# Patient Record
Sex: Male | Born: 1960 | Race: White | Hispanic: No | Marital: Married | Smoking: Current some day smoker
Health system: Southern US, Community
[De-identification: ages and names within clinical notes are randomized; demographics above are authoritative.]

## PROBLEM LIST (undated history)

## (undated) DIAGNOSIS — I10 Essential (primary) hypertension: Secondary | ICD-10-CM

---

## 2013-10-12 ENCOUNTER — Ambulatory Visit (INDEPENDENT_AMBULATORY_CARE_PROVIDER_SITE_OTHER): Payer: Self-pay | Admitting: Emergency Medicine

## 2013-10-12 VITALS — BP 180/112 | HR 95 | Temp 97.8°F | Resp 18 | Ht 68.0 in | Wt 265.6 lb

## 2013-10-12 DIAGNOSIS — I10 Essential (primary) hypertension: Secondary | ICD-10-CM | POA: Insufficient documentation

## 2013-10-12 DIAGNOSIS — F319 Bipolar disorder, unspecified: Secondary | ICD-10-CM

## 2013-10-12 DIAGNOSIS — Z0000 Encounter for general adult medical examination without abnormal findings: Secondary | ICD-10-CM

## 2013-10-12 NOTE — Progress Notes (Signed)
   Subjective:  This chart was scribed for Jonathan ChrisSteven Daub, MD by Elveria Risingimelie Horne, Medial Scribe. This patient was seen in room 13 and the patient's care was started at 6:05 PM.    Patient ID: Jonathan Hays, male    DOB: 07-04-1960, 53 y.o.   MRN: [id number]  HPI HPI Comments: Jonathan NyChristopher Loch is a 53 y.o. male who presents to the [facility] for complete DOT physical. Patient admits that he has not been taking his HTN medication for a few months; state that he just got lazy. Patient's current blood pressure is too high for him to qualify for a card today. Patient's blood pressure today is 164/116.   There are no active problems to display for this patient.  No past medical history on file. No past surgical history on file. No Known Allergies Prior to Admission medications   Medication Sig Start Date End Date Taking? Authorizing Duquan Gillooly  divalproex (DEPAKOTE ER) 500 MG 24 hr tablet Take 1,500 mg by mouth daily.   Yes Historical Violett Hobbs, MD  hydrochlorothiazide (HYDRODIURIL) 25 MG tablet Take 25 mg by mouth daily.   Yes Historical Anjalina Bergevin, MD  lisinopril (PRINIVIL,ZESTRIL) 20 MG tablet Take 20 mg by mouth daily.   Yes Historical Caston Coopersmith, MD  Omega-3 Fatty Acids (FISH OIL) 1000 MG CAPS Take 1,000 mg by mouth daily.   Yes Historical Tammala Weider, MD  QUEtiapine (SEROQUEL) 400 MG tablet Take 400 mg by mouth at bedtime.   Yes Historical Zaida Reiland, MD  simvastatin (ZOCOR) 40 MG tablet Take 40 mg by mouth daily.   Yes Historical Aerion Bagdasarian, MD   History   Social History  . Marital Status: Married    Spouse Name: N/A    Number of Children: N/A  . Years of Education: N/A   Occupational History  . Not on file.   Social History Main Topics  . Smoking status: Not on file  . Smokeless tobacco: Not on file  . Alcohol Use: No  . Drug Use: No  . Sexual Activity: Not on file   Other Topics Concern  . Not on file   Social History Narrative  . No narrative on file        Review of Systems     Objective:   Physical Exam  Nursing note and vitals reviewed.  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 Has been off blood pressure medications for 2 months. He was advised to get three blood pressure readings under 140/90. He was not issued a DOT card today. Patient disqualified until blood pressure is normalizes.

## 2013-10-16 ENCOUNTER — Ambulatory Visit (INDEPENDENT_AMBULATORY_CARE_PROVIDER_SITE_OTHER): Payer: Self-pay | Admitting: Emergency Medicine

## 2013-10-16 VITALS — BP 138/80 | HR 99 | Temp 97.5°F | Resp 18

## 2013-10-16 DIAGNOSIS — Z0289 Encounter for other administrative examinations: Secondary | ICD-10-CM

## 2013-10-16 NOTE — Progress Notes (Signed)
[facility] [address], [place] [id number] [phone]  Date:  10/16/2013   Name:  Jonathan Hays Whipple   DOB:  Nov 18, 1960   MRN:  [id number]  PCP:  No PCP Per Patient    Chief Complaint: No chief complaint on file.   History of Present Illness:  Jonathan Hays Feely is a 53 y.o. very pleasant male patient who presents with the following:  For DOT certification.  HBP and was irregularly taking medications.  Denies other complaint or health concern today. .    Patient Active Problem List   Diagnosis Date Noted  . Unspecified essential hypertension 10/12/2013  . Bipolar disorder, unspecified 10/12/2013    History reviewed. No pertinent past medical history.  History reviewed. No pertinent past surgical history.  History  Substance Use Topics  . Smoking status: Not on file  . Smokeless tobacco: Not on file  . Alcohol Use: No    Family History  Problem Relation Age of Onset  . COPD Father     No Known Allergies  Medication list has been reviewed and updated.  Current Outpatient Prescriptions on File Prior to Visit  Medication Sig Dispense Refill  . divalproex (DEPAKOTE ER) 500 MG 24 hr tablet Take 1,500 mg by mouth daily.      . hydrochlorothiazide (HYDRODIURIL) 25 MG tablet Take 25 mg by mouth daily.      Marland Kitchen. lisinopril (PRINIVIL,ZESTRIL) 20 MG tablet Take 20 mg by mouth daily.      . Omega-3 Fatty Acids (FISH OIL) 1000 MG CAPS Take 1,000 mg by mouth daily.      . QUEtiapine (SEROQUEL) 400 MG tablet Take 400 mg by mouth at bedtime.      . simvastatin (ZOCOR) 40 MG tablet Take 40 mg by mouth daily.       No current facility-administered medications on file prior to visit.    Review of Systems:  As per HPI, otherwise negative.    Physical Examination: Filed Vitals:   10/16/13 1307  BP: 138/80  Pulse: 99  Temp: 97.5 F (36.4 C)  Resp: 18   There were no vitals filed for this visit. There is no weight on file to calculate  BMI. Ideal Body Weight:    GEN: WDWN, NAD, Non-toxic, A & O x 3 HEENT: Atraumatic, Normocephalic. Neck supple. No masses, No LAD. Ears and Nose: No external deformity. CV: RRR, No M/G/R. No JVD. No thrill. No extra heart sounds. PULM: CTA B, no wheezes, crackles, rhonchi. No retractions. No resp. distress. No accessory muscle use. ABD: S, NT, ND, +BS. No rebound. No HSM. EXTR: No c/c/e NEURO Normal gait.  PSYCH: Normally interactive. Conversant. Not depressed or anxious appearing.  Calm demeanor.    Assessment and Plan: DOT certification.   HBP Bipolar disorder   Signed,  Phillips OdorJeffery Anderson, MD

## 2013-10-16 NOTE — Patient Instructions (Signed)
Hypertension Hypertension, commonly called high blood pressure, is when the force of blood pumping through your arteries is too strong. Your arteries are the blood vessels that carry blood from your heart throughout your body. A blood pressure reading consists of a higher number over a lower number, such as 110/72. The higher number (systolic) is the pressure inside your arteries when your heart pumps. The lower number (diastolic) is the pressure inside your arteries when your heart relaxes. Ideally you want your blood pressure below 120/80. Hypertension forces your heart to work harder to pump blood. Your arteries may become narrow or stiff. Having hypertension puts you at risk for heart disease, stroke, and other problems.  RISK FACTORS Some risk factors for high blood pressure are controllable. Others are not.  Risk factors you cannot control include:   Race. You may be at higher risk if you are African American.  Age. Risk increases with age.  Gender. Men are at higher risk than women before age 45 years. After age 65, women are at higher risk than men. Risk factors you can control include:  Not getting enough exercise or physical activity.  Being overweight.  Getting too much fat, sugar, calories, or salt in your diet.  Drinking too much alcohol. SIGNS AND SYMPTOMS Hypertension does not usually cause signs or symptoms. Extremely high blood pressure (hypertensive crisis) may cause headache, anxiety, shortness of breath, and nosebleed. DIAGNOSIS  To check if you have hypertension, your health care provider will measure your blood pressure while you are seated, with your arm held at the level of your heart. It should be measured at least twice using the same arm. Certain conditions can cause a difference in blood pressure between your right and left arms. A blood pressure reading that is higher than normal on one occasion does not mean that you need treatment. If one blood pressure reading  is high, ask your health care provider about having it checked again. TREATMENT  Treating high blood pressure includes making lifestyle changes and possibly taking medication. Living a healthy lifestyle can help lower high blood pressure. You may need to change some of your habits. Lifestyle changes may include:  Following the DASH diet. This diet is high in fruits, vegetables, and whole grains. It is low in salt, red meat, and added sugars.  Getting at least 2 1/2 hours of brisk physical activity every week.  Losing weight if necessary.  Not smoking.  Limiting alcoholic beverages.  Learning ways to reduce stress. If lifestyle changes are not enough to get your blood pressure under control, your health care provider may prescribe medicine. You may need to take more than one. Work closely with your health care provider to understand the risks and benefits. HOME CARE INSTRUCTIONS  Have your blood pressure rechecked as directed by your health care provider.   Only take medicine as directed by your health care provider. Follow the directions carefully. Blood pressure medicines must be taken as prescribed. The medicine does not work as well when you skip doses. Skipping doses also puts you at risk for problems.   Do not smoke.   Monitor your blood pressure at home as directed by your health care provider. SEEK MEDICAL CARE IF:   You think you are having a reaction to medicines taken.  You have recurrent headaches or feel dizzy.  You have swelling in your ankles.  You have trouble with your vision. SEEK IMMEDIATE MEDICAL CARE IF:  You develop a severe headache or   confusion.  You have unusual weakness, numbness, or feel faint.  You have severe chest or abdominal pain.  You vomit repeatedly.  You have trouble breathing. MAKE SURE YOU:   Understand these instructions.  Will watch your condition.  Will get help right away if you are not doing well or get  worse. Document Released: 04/05/2005 Document Revised: 04/10/2013 Document Reviewed: 01/26/2013 Exit[employer] Patient Information 2015 [employer]. This information is not intended to replace advice given to you by your health care provider. Make sure you discuss any questions you have with your health care provider.  

## 2015-08-19 ENCOUNTER — Ambulatory Visit
Admission: EM | Admit: 2015-08-19 | Discharge: 2015-08-19 | Disposition: A | Discharge status: Home / Self Care | Payer: Worker's Compensation | Attending: Family Medicine | Admitting: Family Medicine

## 2015-08-19 ENCOUNTER — Encounter: Payer: Self-pay | Admitting: Emergency Medicine

## 2015-08-19 DIAGNOSIS — S61210A Laceration without foreign body of right index finger without damage to nail, initial encounter: Secondary | ICD-10-CM | POA: Diagnosis not present

## 2015-08-19 HISTORY — DX: Essential (primary) hypertension: I10

## 2015-08-19 MED ORDER — TETANUS-DIPHTH-ACELL PERTUSSIS 5-2.5-18.5 LF-MCG/0.5 IM SUSP
0.5000 mL | Freq: Once | INTRAMUSCULAR | Status: AC
  Administered 2015-08-19: 0.5 mL via INTRAMUSCULAR

## 2015-08-19 NOTE — ED Notes (Signed)
Patient states he cut his right index finger on a piece of metal. Is not sure when last t-dap was.

## 2015-08-19 NOTE — Discharge Instructions (Signed)
Keep clean and dry. Keep covered at work.  Follow up with your primary care physician this week as needed. Return to Urgent care for new or worsening concerns.    Laceration Care, Adult A laceration is a cut that goes through all layers of the skin. The cut also goes into the tissue that is right under the skin. Some cuts heal on their own. Others need to be closed with stitches (sutures), staples, skin adhesive strips, or wound glue. Taking care of your cut lowers your risk of infection and helps your cut to heal better. HOW TO TAKE CARE OF YOUR CUT For stitches or staples:  Keep the wound clean and dry.  If you were given a bandage (dressing), you should change it at least one time per day or as told by your doctor. You should also change it if it gets wet or dirty.  Keep the wound completely dry for the first 24 hours or as told by your doctor. After that time, you may take a shower or a bath. However, make sure that the wound is not soaked in water until after the stitches or staples have been removed.  Clean the wound one time each day or as told by your doctor:  Wash the wound with soap and water.  Rinse the wound with water until all of the soap comes off.  Pat the wound dry with a clean towel. Do not rub the wound.  After you clean the wound, put a thin layer of antibiotic ointment on it as told by your doctor. This ointment:  Helps to prevent infection.  Keeps the bandage from sticking to the wound.  Have your stitches or staples removed as told by your doctor. If your doctor used skin adhesive strips:   Keep the wound clean and dry.  If you were given a bandage, you should change it at least one time per day or as told by your doctor. You should also change it if it gets dirty or wet.  Do not get the skin adhesive strips wet. You can take a shower or a bath, but be careful to keep the wound dry.  If the wound gets wet, pat it dry with a clean towel. Do not rub the  wound.  Skin adhesive strips fall off on their own. You can trim the strips as the wound heals. Do not remove any strips that are still stuck to the wound. They will fall off after a while. If your doctor used wound glue:  Try to keep your wound dry, but you may briefly wet it in the shower or bath. Do not soak the wound in water, such as by swimming.  After you take a shower or a bath, gently pat the wound dry with a clean towel. Do not rub the wound.  Do not do any activities that will make you really sweaty until the skin glue has fallen off on its own.  Do not apply liquid, cream, or ointment medicine to your wound while the skin glue is still on.  If you were given a bandage, you should change it at least one time per day or as told by your doctor. You should also change it if it gets dirty or wet.  If a bandage is placed over the wound, do not let the tape for the bandage touch the skin glue.  Do not pick at the glue. The skin glue usually stays on for 5-10 days. Then, it falls  off of the skin. General Instructions  To help prevent scarring, make sure to cover your wound with sunscreen whenever you are outside after stitches are removed, after adhesive strips are removed, or when wound glue stays in place and the wound is healed. Make sure to wear a sunscreen of at least 30 SPF.  Take over-the-counter and prescription medicines only as told by your doctor.  If you were given antibiotic medicine or ointment, take or apply it as told by your doctor. Do not stop using the antibiotic even if your wound is getting better.  Do not scratch or pick at the wound.  Keep all follow-up visits as told by your doctor. This is important.  Check your wound every day for signs of infection. Watch for:  Redness, swelling, or pain.  Fluid, blood, or pus.  Raise (elevate) the injured area above the level of your heart while you are sitting or lying down, if possible. GET HELP IF:  You got a  tetanus shot and you have any of these problems at the injection site:  Swelling.  Very bad pain.  Redness.  Bleeding.  You have a fever.  A wound that was closed breaks open.  You notice a bad smell coming from your wound or your bandage.  You notice something coming out of the wound, such as wood or glass.  Medicine does not help your pain.  You have more redness, swelling, or pain at the site of your wound.  You have fluid, blood, or pus coming from your wound.  You notice a change in the color of your skin near your wound.  You need to change the bandage often because fluid, blood, or pus is coming from the wound.  You start to have a new rash.  You start to have numbness around the wound. GET HELP RIGHT AWAY IF:  You have very bad swelling around the wound.  Your pain suddenly gets worse and is very bad.  You notice painful lumps near the wound or on skin that is anywhere on your body.  You have a red streak going away from your wound.  The wound is on your hand or foot and you cannot move a finger or toe like you usually can.  The wound is on your hand or foot and you notice that your fingers or toes look pale or bluish.   This information is not intended to replace advice given to you by your health care provider. Make sure you discuss any questions you have with your health care provider.   Document Released: 09/22/2007 Document Revised: 08/20/2014 Document Reviewed: 04/01/2014 Elsevier Interactive Patient Education 2016 [employer]  Nonsutured Laceration Care A laceration is a cut that goes through all layers of the skin and extends into the tissue that is right under the skin. This type of cut is usually stitched up (sutured) or closed with tape (adhesive strips) or skin glue shortly after the injury happens. However, if the wound is dirty or if several hours pass before medical treatment is provided, it is likely that germs (bacteria) will enter the  wound. Closing a laceration after bacteria have entered it increases the risk of infection. In these cases, your health care provider may leave the laceration open (nonsutured) and cover it with a bandage. This type of treatment helps prevent infection and allows the wound to heal from the deepest layer of tissue damage up to the surface. An open fracture is a type of injury  that may involve nonsutured lacerations. An open fracture is a break in a bone that happens along with one or more lacerations through the skin that is near the fracture site. HOW TO CARE FOR YOUR NONSUTURED LACERATION  Take or apply over-the-counter and prescription medicines only as told by your health care provider.  If you were prescribed an antibiotic medicine, take or apply it as told by your health care provider. Do not stop using the antibiotic even if your condition improves.  Clean the wound one time each day or as told by your health care provider.  Wash the wound with mild soap and water.  Rinse the wound with water to remove all soap.  Pat your wound dry with a clean towel. Do not rub the wound.  Do not inject anything into the wound unless your health care provider told you to.  Change any bandages (dressings) as told by your health care provider. This includes changing the dressing if it gets wet, dirty, or starts to smell bad.  Keep the dressing dry until your health care provider says it can be removed. Do not take baths, swim, or do anything that puts your wound underwater until your health care provider approves.  Raise (elevate) the injured area above the level of your heart while you are sitting or lying down, if possible.  Do not scratch or pick at the wound.  Check your wound every day for signs of infection. Watch for:  Redness, swelling, or pain.  Fluid, blood, or pus.  Keep all follow-up visits as told by your health care provider. This is important. SEEK MEDICAL CARE IF:  You received  a tetanus and shot and you have swelling, severe pain, redness, or bleeding at the injection site.   You have a fever.  Your pain is not controlled with medicine.  You have increased redness, swelling, or pain at the site of your wound.  You have fluid, blood, or pus coming from your wound.  You notice a bad smell coming from your wound or your dressing.  You notice something coming out of the wound, such as wood or glass.  You notice a change in the color of your skin near your wound.  You develop a new rash.  You need to change the dressing frequently due to fluid, blood, or pus draining from the wound.  You develop numbness around your wound. SEEK IMMEDIATE MEDICAL CARE IF:  Your pain suddenly increases and is severe.  You develop severe swelling around the wound.  The wound is on your hand or foot and you cannot properly move a finger or toe.  The wound is on your hand or foot and you notice that your fingers or toes look pale or bluish.  You have a red streak going away from your wound.   This information is not intended to replace advice given to you by your health care provider. Make sure you discuss any questions you have with your health care provider.   Document Released: 03/03/2006 Document Revised: 08/20/2014 Document Reviewed: 04/01/2014 Elsevier Interactive Patient Education [employer]

## 2015-08-19 NOTE — ED Provider Notes (Signed)
[facility]  ____________________________________________  Time seen: Approximately 10:09 PM  I have reviewed the triage vital signs and the nursing notes.   HISTORY  Chief Complaint Laceration    HPI Jonathan Hays is a 55 y.o. male presents with complaints of right second digit finger laceration. Patient reports that just prior to arrival he was at work. 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 Patient states that as he was sliding a piece of metal downwards he slid his finger along the outer edge of piece of fresh the cut metal which cut his right second digit. Denies any other trauma or crush injury. Denies any pain this time. 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 Reports he is right-hand dominant. Unsure of last tetanus immunization.  Denies any numbness or tingling sensation. Denies decreased range of motion, other injury or other laceration. Denies fall, head injury or loss consciousness.      Past Medical History  Diagnosis Date  . Hypertension     Patient Active Problem List   Diagnosis Date Noted  . Unspecified essential hypertension 10/12/2013  . Bipolar disorder, unspecified (HCC) 10/12/2013    History reviewed. No pertinent past surgical history.  Current Outpatient Rx  Name  Route  Sig  Dispense  Refill  . divalproex (DEPAKOTE ER) 500 MG 24 hr tablet   Oral   Take 1,500 mg by mouth daily.         . hydrochlorothiazide (HYDRODIURIL) 25 MG tablet   Oral   Take 25 mg by mouth daily.         Marland Kitchen. lisinopril (PRINIVIL,ZESTRIL) 20 MG tablet   Oral   Take 20 mg by mouth daily.         . Omega-3 Fatty Acids (FISH OIL) 1000 MG CAPS   Oral   Take 1,000 mg by mouth daily.         . QUEtiapine (SEROQUEL) 400 MG tablet   Oral   Take 400 mg by mouth at bedtime.         . simvastatin (ZOCOR) 40 MG tablet   Oral   Take 40 mg by mouth daily.           Allergies Review of patient's  allergies indicates no known allergies.  Family History  Problem Relation Age of Onset  . COPD Father     Social History Social History  Substance Use Topics  . Smoking status: Current Some Day Smoker  . Smokeless tobacco: None  . Alcohol Use: No    Review of Systems Constitutional: No fever/chills Eyes: No visual changes. ENT: No sore throat. Cardiovascular: Denies chest pain. Respiratory: Denies shortness of breath. Gastrointestinal: No abdominal pain.  No nausea, no vomiting.  No diarrhea.  No constipation. Genitourinary: Negative for dysuria. Musculoskeletal: Negative for back pain. Skin: Negative for rash.As above.  Neurological: Negative for headaches, focal weakness or numbness.  10-point ROS otherwise negative.  ____________________________________________   PHYSICAL EXAM:  VITAL SIGNS: ED Triage Vitals  Enc Vitals Group     BP 08/19/15 2035 151/83 mmHg     Pulse Rate 08/19/15 2035 80     Resp 08/19/15 2035 20     Temp 08/19/15 2035 97.9 F (36.6 C)     Temp Source 08/19/15 2035 Tympanic     SpO2 08/19/15 2035 96 %     Weight 08/19/15 2035 250 lb (113.399 kg)     Height 08/19/15 2035 5\' 9"  (1.753 m)  Head Cir --      Peak Flow --      Pain Score 08/19/15 2034 0     Pain Loc --      Pain Edu? --      Excl. in GC? --     Constitutional: Alert and oriented. Well appearing and in no acute distress. Eyes: Conjunctivae are normal. PERRL. EOMI. Head: Atraumatic.  Nose: No congestion/rhinnorhea.  Mouth/Throat: Mucous membranes are moist.   Neck: No stridor.  No cervical spine tenderness to palpation.  Cardiovascular: Normal rate, regular rhythm. Grossly normal heart sounds.  Good peripheral circulation. Respiratory: Normal respiratory effort.  No retractions. Lungs CTAB. Gastrointestinal: Soft and nontender.  Musculoskeletal: No lower or upper extremity tenderness nor edema.  Bilateral hand grips strong and equal. Bilateral distal radial pulses equal  and easily palpable. See skin below. Right hand nontender, right hand no motor or tendon deficit, neurovascular intact. Neurologic:  Normal speech and language. No gross focal neurologic deficits are appreciated. No gait instability. Skin:  Skin is warm, dry and intact. No rash noted. Except: Right second digit middle lateral volar phalanx superficial 2.5 cm laceration present with minimal active bleeding, nontender, no swelling, no ecchymosis, nontender, full range of motion, capillary refill less than 2 seconds to distal second right finger, no foreign bodies visualized, no tendon visualized, no motor or tendon deficit. Psychiatric: Mood and affect are normal. Speech and behavior are normal.  ____________________________________________   LABS (all labs ordered are listed, but only abnormal results are displayed)  Labs Reviewed - No data to display ____________________________________________  PROCEDURES  Procedure(s) performed:   Procedure(s) performed:  Procedure explained and verbal consent obtained. Consent: Verbal consent obtained. Written consent not obtained. Risks and benefits: risks, benefits and alternatives were discussed Patient identity confirmed: verbally with patient and hospital-assigned identification number  Consent given by: patient   Laceration Repair Location: Right second finger Length: 2.5 cm Foreign bodies: no foreign bodies Tendon involvement: none Nerve involvement: none Preparation: Patient was prepped and draped in the usual sterile fashion. Anesthesia none Irrigation solution: saline and Betadine  Irrigation method: jet lavage Amount of cleaning: copious Suturing not indicated. Repaired with sterile adhesive  Patient tolerate well. Wound well approximated post repair.   dressing applied.  Wound care instructions provided.  Observe for any signs of infection or other problems.     ____________________________________________   INITIAL  IMPRESSION / ASSESSMENT AND PLAN / ED COURSE  Pertinent labs & imaging results that were available during my care of the patient were reviewed by me and considered in my medical decision making (see chart for details).  Very well-appearing patient. No acute distress. Presents for complaint of laceration to right second middle phalanx. 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 injury. Superficial 2.5 cm laceration present, nontender, no bony tenderness, neurovascular intact. Wound copiously irrigated and cleaned. Wound repaired with sterile adhesive. Patient tolerated well. Directed to keep clean and dry. Work note given that patient may return to work without any restrictions except for wound area to stay clean and dry for 7 days. Tetanus immunization updated.  Discussed follow up with Primary care physician this week. Discussed follow up and return parameters including no resolution or any worsening concerns. Patient verbalized understanding and agreed to plan.   ____________________________________________   FINAL CLINICAL IMPRESSION(S) / ED DIAGNOSES  Final diagnoses:  Laceration of right index finger w/o foreign body w/o damage to nail, initial encounter      Note: This dictation was prepared  with Dragon dictation along with smaller phrase technology. Any transcriptional errors that result from this process are unintentional.    Renford Dills, NP 08/19/15 2304

## 2018-10-18 ENCOUNTER — Encounter: Payer: Self-pay | Admitting: *Deleted

## 2018-10-18 ENCOUNTER — Emergency Department: Payer: No Typology Code available for payment source

## 2018-10-18 ENCOUNTER — Other Ambulatory Visit: Payer: Self-pay

## 2018-10-18 DIAGNOSIS — Y990 Civilian activity done for income or pay: Secondary | ICD-10-CM | POA: Insufficient documentation

## 2018-10-18 DIAGNOSIS — Y929 Unspecified place or not applicable: Secondary | ICD-10-CM | POA: Insufficient documentation

## 2018-10-18 DIAGNOSIS — W010XXA Fall on same level from slipping, tripping and stumbling without subsequent striking against object, initial encounter: Secondary | ICD-10-CM | POA: Diagnosis not present

## 2018-10-18 DIAGNOSIS — I10 Essential (primary) hypertension: Secondary | ICD-10-CM | POA: Insufficient documentation

## 2018-10-18 DIAGNOSIS — F17210 Nicotine dependence, cigarettes, uncomplicated: Secondary | ICD-10-CM | POA: Diagnosis not present

## 2018-10-18 DIAGNOSIS — Z79899 Other long term (current) drug therapy: Secondary | ICD-10-CM | POA: Insufficient documentation

## 2018-10-18 DIAGNOSIS — Y939 Activity, unspecified: Secondary | ICD-10-CM | POA: Insufficient documentation

## 2018-10-18 DIAGNOSIS — S0990XA Unspecified injury of head, initial encounter: Secondary | ICD-10-CM | POA: Diagnosis not present

## 2018-10-18 NOTE — ED Notes (Signed)
Worker's Comp:  Pt employer requests a UDS.  Consents obtained from pt.  Specimen collected and released to lab via chain of custody protocol.  Specimen ID no [id number]

## 2018-10-18 NOTE — ED Triage Notes (Signed)
Pt fell at work tonight.  Pt stepped onto cardboard that covered a hydraulic spill and pt fell.  Pt had loc and was confused.  Pt brought in via ems.  On arrival to triage, pt alert.  Pt has hematoma to right side of head.  Pt has a headache.  No vomiting.  Pt alert.   Speech clear.  Ambulates without diff.  Pt states WC.

## 2018-10-19 ENCOUNTER — Emergency Department
Admission: EM | Admit: 2018-10-19 | Discharge: 2018-10-19 | Disposition: A | Discharge status: Home / Self Care | Payer: No Typology Code available for payment source | Attending: Emergency Medicine | Admitting: Emergency Medicine

## 2018-10-19 DIAGNOSIS — W19XXXA Unspecified fall, initial encounter: Secondary | ICD-10-CM

## 2018-10-19 DIAGNOSIS — S0990XA Unspecified injury of head, initial encounter: Secondary | ICD-10-CM

## 2018-10-19 NOTE — ED Provider Notes (Signed)
[facility] Emergency Department Provider Note  ____________________________________________   First MD Initiated Contact with Patient 10/19/18 0309     (approximate)  I have reviewed the triage vital signs and the nursing notes.   HISTORY  Chief Complaint Fall    HPI Jonathan Hays is a 58 y.o. male with medical history as listed below who presents for evaluation after a fall at work.  He reports that he slipped in some hydraulic fluid and apparently fell and struck his head.  He does not really remember what happened.  He felt stunned for little while and wonders if he lost consciousness.  He currently has a very mild headache and has a couple of knots on his head.  He has no neck pain.  He denies any recent infection, fever/chills, sore throat, chest pain, shortness of breath, nausea, vomiting, abdominal pain.  He has no pain in his arms or his legs.  He had a history of a prior concussion but he does not participate in any athletic or potentially dangerous activities.  He is ambulatory without difficulty.         Past Medical History:  Diagnosis Date  . Hypertension     Patient Active Problem List   Diagnosis Date Noted  . Unspecified essential hypertension 10/12/2013  . Bipolar disorder, unspecified ([place]) 10/12/2013    No past surgical history on file.  Prior to Admission medications   Medication Sig Start Date End Date Taking? Authorizing Provider  divalproex (DEPAKOTE ER) 500 MG 24 hr tablet Take 1,500 mg by mouth daily.    [provider]  hydrochlorothiazide (HYDRODIURIL) 25 MG tablet Take 25 mg by mouth daily.    [provider]  lisinopril (PRINIVIL,ZESTRIL) 20 MG tablet Take 20 mg by mouth daily.    [provider]  Omega-3 Fatty Acids (FISH OIL) 1000 MG CAPS Take 1,000 mg by mouth daily.    [provider]  QUEtiapine (SEROQUEL) 400 MG tablet Take 400 mg by mouth at bedtime.    [provider]  simvastatin (ZOCOR) 40 MG tablet Take 40 mg by mouth daily.    [provider]    Allergies Patient has no known allergies.  Family History  Problem Relation Age of Onset  . COPD Father     Social History Social History   Tobacco Use  . Smoking status: Current Some Day Smoker  . Smokeless tobacco: Never Used  Substance Use Topics  . Alcohol use: No  . Drug use: No    Review of Systems Constitutional: No fever/chills Eyes: No visual changes. ENT: No sore throat. Cardiovascular: Denies chest pain. Respiratory: Denies shortness of breath. Gastrointestinal: No abdominal pain.  No nausea, no vomiting.  No diarrhea.  No constipation. Genitourinary: Negative for dysuria. Musculoskeletal: Mild pain in the back of his head with a few hematomas.  Negative for neck pain.  Negative for back pain. Integumentary: Negative for rash. Neurological: Negative for headaches, focal weakness or numbness.   ____________________________________________   PHYSICAL EXAM:  VITAL SIGNS: ED Triage Vitals  Enc Vitals Group     BP 10/18/18 2145 (!) 158/96     Pulse Rate 10/18/18 2145 85     Resp 10/18/18 2145 20     Temp 10/18/18 2145 98.6 F (37 C)     Temp Source 10/18/18 2145 Oral     SpO2 10/18/18 2145 95 %     Weight --      Height --  Head Circumference --      Peak Flow --      Pain Score 10/18/18 2142 3     Pain Loc --      Pain Edu? --      Excl. in GC? --     Constitutional: Alert and oriented. Well appearing and in no acute distress. Eyes: Conjunctivae are normal.  Head: The patient has 2 hematomas, one on the occiput and one on the right side of his head.  No lacerations, no bleeding. Nose: No congestion/rhinnorhea. Mouth/Throat: Mucous membranes are moist. Neck: No stridor.  No meningeal signs.  No tenderness palpation of the cervical spine. Cardiovascular: Normal rate, regular rhythm. Good peripheral circulation. Grossly normal heart  sounds. Respiratory: Normal respiratory effort.  No retractions. No audible wheezing. Gastrointestinal: Soft and nontender. No distention.  Musculoskeletal: No lower extremity tenderness nor edema. No gross deformities of extremities. Neurologic:  Normal speech and language. No gross focal neurologic deficits are appreciated.  No perseveration. Skin:  Skin is warm, dry and intact. No rash noted. Psychiatric: Mood and affect are normal. Speech and behavior are normal.  ____________________________________________   LABS (all labs ordered are listed, but only abnormal results are displayed)  Labs Reviewed - No data to display ____________________________________________  EKG  No indication for EKG ____________________________________________  RADIOLOGY   ED MD interpretation: No Indication of acute intracranial abnormality or cervical spine injury.  Official radiology report(s): Ct Head Wo Contrast  Result Date: 10/18/2018 CLINICAL DATA:  Fall, headache EXAM: CT HEAD WITHOUT CONTRAST CT CERVICAL SPINE WITHOUT CONTRAST TECHNIQUE: Multidetector CT imaging of the head and cervical spine was performed following the standard protocol without intravenous contrast. Multiplanar CT image reconstructions of the cervical spine were also generated. COMPARISON:  None. FINDINGS: CT HEAD FINDINGS Brain: No acute intracranial abnormality. Specifically, no hemorrhage, hydrocephalus, mass lesion, acute infarction, or significant intracranial injury. Vascular: No hyperdense vessel or unexpected calcification. Skull: No acute calvarial abnormality. Sinuses/Orbits: Visualized paranasal sinuses and mastoids clear. Orbital soft tissues unremarkable. Other: None CT CERVICAL SPINE FINDINGS Alignment: No subluxation.  Loss of cervical lordosis. Skull base and vertebrae: No acute fracture. No primary bone lesion or focal pathologic process. Soft tissues and spinal canal: No prevertebral fluid or swelling. No visible  canal hematoma. Disc levels: Diffuse degenerative facet disease bilaterally. Degenerative disc disease from C4-5 through C6-7 with disc space narrowing and spurring. Upper chest: No acute findings Other: None IMPRESSION: No acute intracranial abnormality. Cervical spondylosis. Cervical straightening. No acute bony abnormality. Electronically Signed   By: Charlett NoseKevin  Dover M.D.   On: 10/18/2018 22:47   Ct Cervical Spine Wo Contrast  Result Date: 10/18/2018 CLINICAL DATA:  Fall, headache EXAM: CT HEAD WITHOUT CONTRAST CT CERVICAL SPINE WITHOUT CONTRAST TECHNIQUE: Multidetector CT imaging of the head and cervical spine was performed following the standard protocol without intravenous contrast. Multiplanar CT image reconstructions of the cervical spine were also generated. COMPARISON:  None. FINDINGS: CT HEAD FINDINGS Brain: No acute intracranial abnormality. Specifically, no hemorrhage, hydrocephalus, mass lesion, acute infarction, or significant intracranial injury. Vascular: No hyperdense vessel or unexpected calcification. Skull: No acute calvarial abnormality. Sinuses/Orbits: Visualized paranasal sinuses and mastoids clear. Orbital soft tissues unremarkable. Other: None CT CERVICAL SPINE FINDINGS Alignment: No subluxation.  Loss of cervical lordosis. Skull base and vertebrae: No acute fracture. No primary bone lesion or focal pathologic process. Soft tissues and spinal canal: No prevertebral fluid or swelling. No visible canal hematoma. Disc levels: Diffuse degenerative facet disease bilaterally. Degenerative disc disease  from C4-5 through C6-7 with disc space narrowing and spurring. Upper chest: No acute findings Other: None IMPRESSION: No acute intracranial abnormality. Cervical spondylosis. Cervical straightening. No acute bony abnormality. Electronically Signed   By: Charlett NoseKevin  Dover M.D.   On: 10/18/2018 22:47    ____________________________________________   PROCEDURES   Procedure(s) performed (including  Critical Care):  Procedures   ____________________________________________   INITIAL IMPRESSION / MDM / ASSESSMENT AND PLAN / ED COURSE  As part of my medical decision making, I reviewed the following data within the electronic MEDICAL RECORD NUMBER Nursing notes reviewed and incorporated, Old chart reviewed and Notes from prior ED visits      Differential diagnosis includes, but is not limited to, concussion, minor head trauma, scalp contusion, intracranial bleeding.  The patient had to wait a long time for an exam room and was observed for a total of over 7 hours in the ED.  He is asymptomatic suffer a mild headache.  CT scan is reassuring.  No neurological deficits, no perseveration, etc.  I had my usual customary head injury discussion with the patient and he understands and agrees with plan for outpatient follow-up.  I gave my typical return precautions.      ____________________________________________  FINAL CLINICAL IMPRESSION(S) / ED DIAGNOSES  Final diagnoses:  Fall, initial encounter  Minor head injury, initial encounter     MEDICATIONS GIVEN DURING THIS VISIT:  Medications - No data to display   ED Discharge Orders    None      *Please note:  Kevan NyChristopher Brecheisen was evaluated in Emergency Department on 10/19/2018 for the symptoms described in the history of present illness. He was evaluated in the context of the global COVID-19 pandemic, which necessitated consideration that the patient might be at risk for infection with the SARS-CoV-2 virus that causes COVID-19. Institutional protocols and algorithms that pertain to the evaluation of patients at risk for COVID-19 are in a state of rapid change based on information released by regulatory bodies including the CDC and federal and state organizations. These policies and algorithms were followed during the patient's care in the ED.  Some ED evaluations and interventions may be delayed as a result of limited staffing during  the pandemic.*  Note:  This document was prepared using Dragon voice recognition software and may include unintentional dictation errors.   Loleta RoseForbach, Yovanni Frenette, MD 10/19/18 [phone]

## 2018-10-19 NOTE — ED Notes (Signed)
Pt A&Ox4. Pt has two firm hematomas approx 3cm each, on on the right side of his head and one on the posterior scalp. No evidence of laceration.

## 2018-10-19 NOTE — Discharge Instructions (Signed)
You were seen in the Emergency Department (ED) today for a head injury.  Your CT scans did not show any evidence of serious injury or bleeding.    Signs of a more serious head injury include vomiting, severe headache, excessive sleepiness or confusion, and weakness or numbness in your face, arms or legs.  Return immediately to the Emergency Department if you experience any of these more concerning symptoms.    Rest, avoid strenuous physical or mental activity, and avoid activities that could potentially result in another head injury until all your symptoms from this head injury are completely resolved for at least 2-3 weeks.  If you participate in sports, get cleared by your doctor or trainer before returning to play.  You may take ibuprofen or acetaminophen over the counter according to label instructions for mild headache or scalp soreness.

## 2019-12-22 IMAGING — CT CT HEAD WITHOUT CONTRAST
5 of 7 series · 17 of 47 positions shown, 18 images · non-contrast
Comparison: None.

CLINICAL DATA: Fall, headache

EXAM:
CT HEAD WITHOUT CONTRAST
CT CERVICAL SPINE WITHOUT CONTRAST
TECHNIQUE: Multidetector CT imaging of the head and cervical spine was
performed following the standard protocol without intravenous
contrast. Multiplanar CT image reconstructions of the cervical spine
were also generated.

[Series 2: head wo · axial · 0.46mm/px · z∈[+186,+236]mm · 2 of 31 slices shown, 3 images]
[im 11/31  brain]
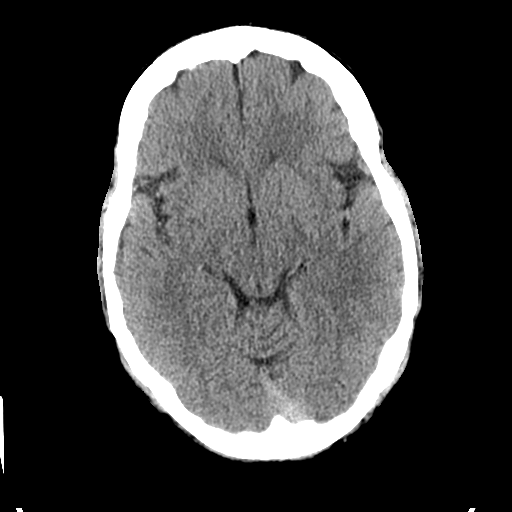
[im 11/31  bone]
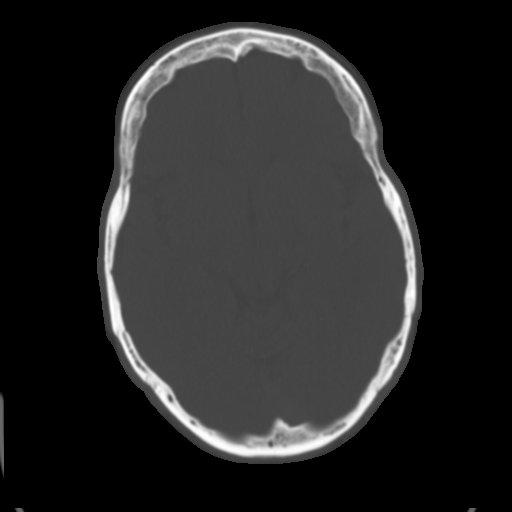
[im 21/31  brain]
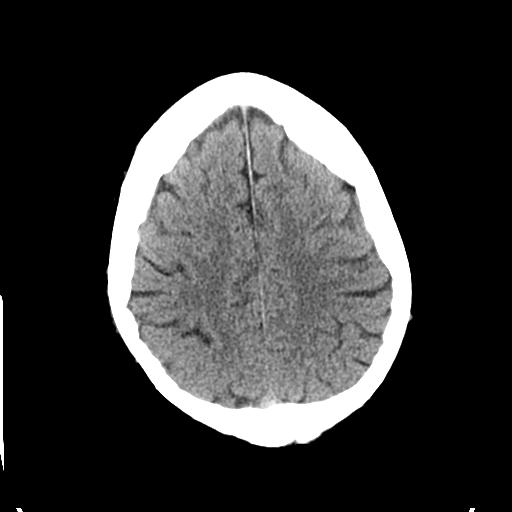

[Series 4: coronal soft tissue · coronal · 0.31mm/px · 3 of 70 slices shown]
[im 20/70  brain]
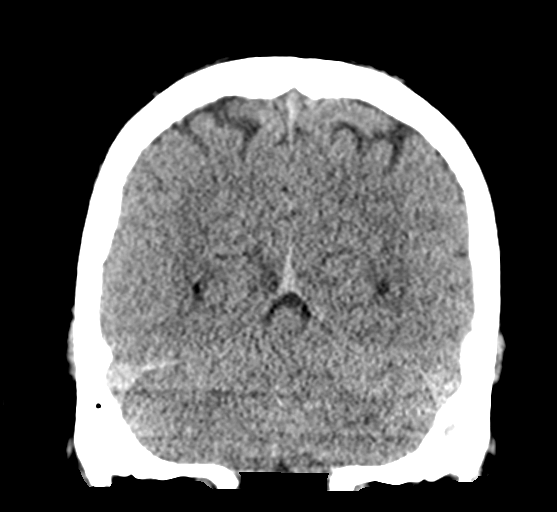
[im 30/70  brain]
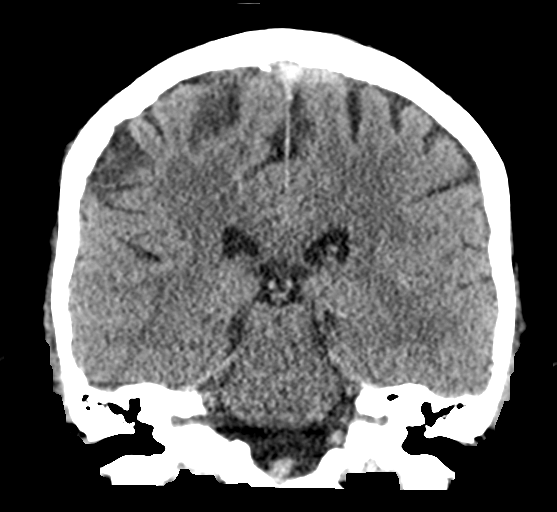
[im 40/70  brain]
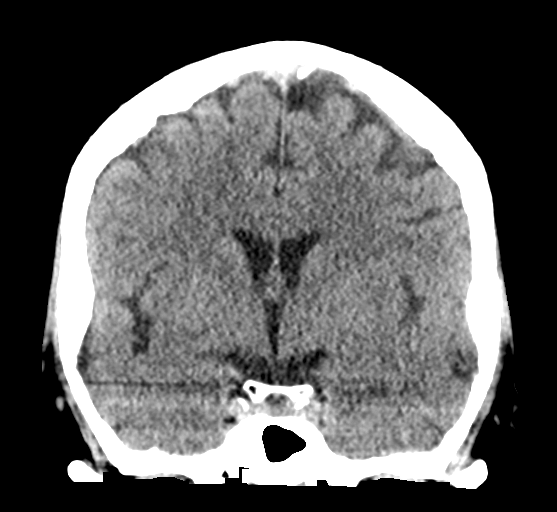

[Series 5: sagittal soft tissue · sagittal · 0.31mm/px · 1 of 54 slices shown]
[im 27/54  brain]
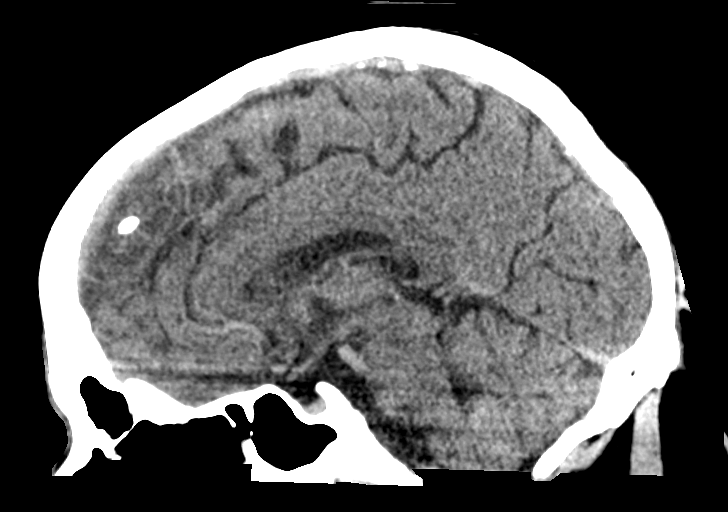

[Series 7: c spine soft · axial · 0.31mm/px · z∈[-40,+14]mm · 3 of 108 slices shown]
[im 9/108  brain]
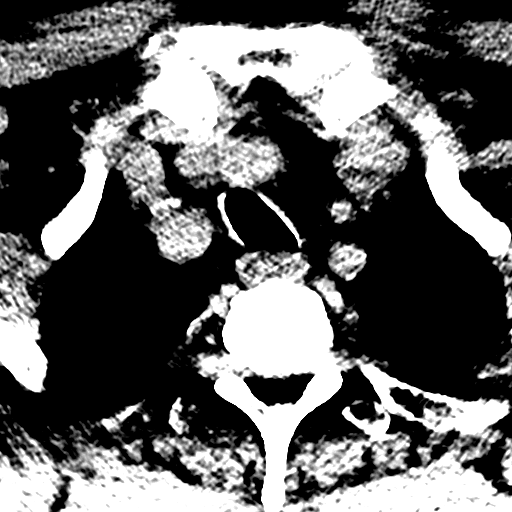
[im 27/108  brain]
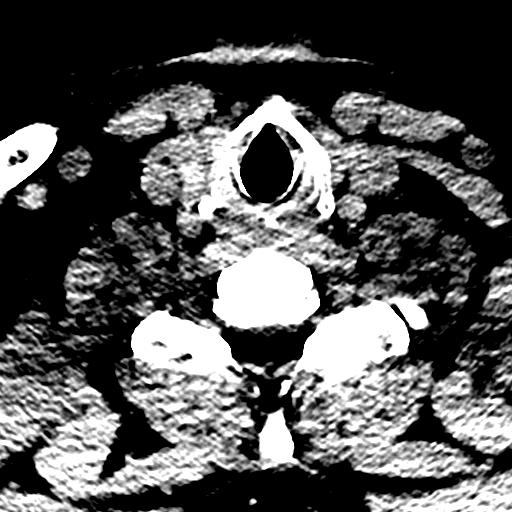
[im 36/108  brain]
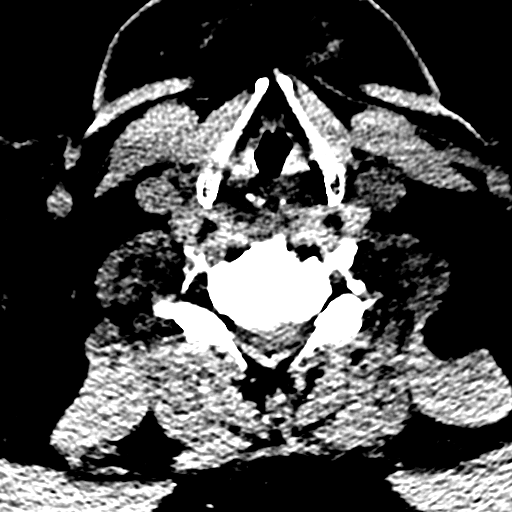

[Series 10: orthogonal bone · axial · 0.28mm/px · z∈[-48,+118]mm · 8 of 105 slices shown]
[im 9/105  bone]
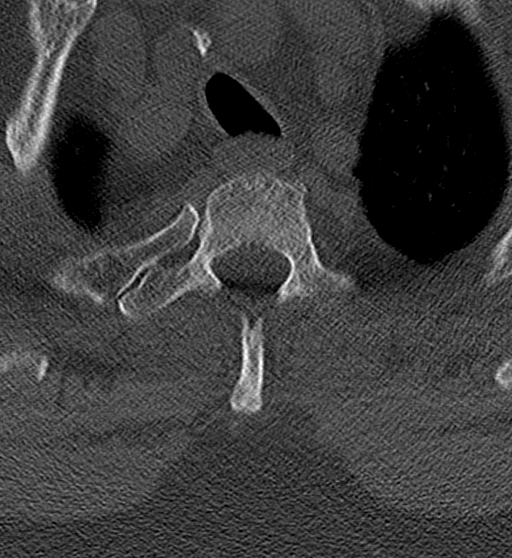
[im 27/105  bone]
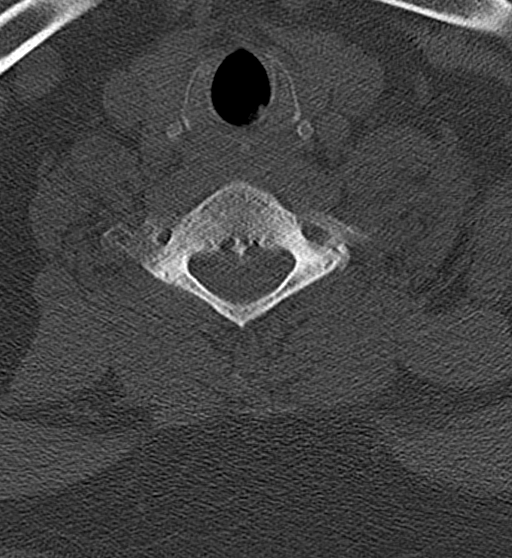
[im 35/105  bone]
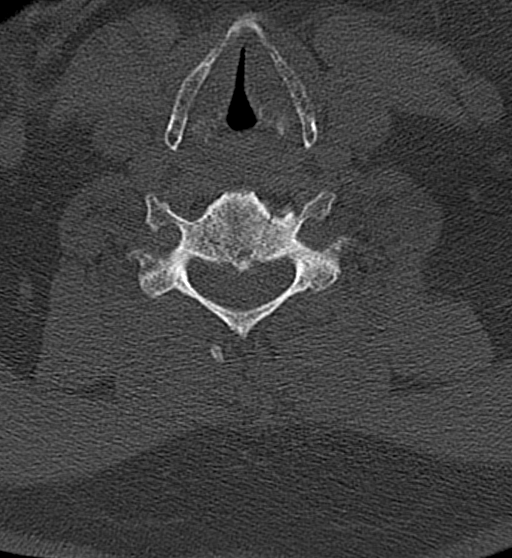
[im 44/105  bone]
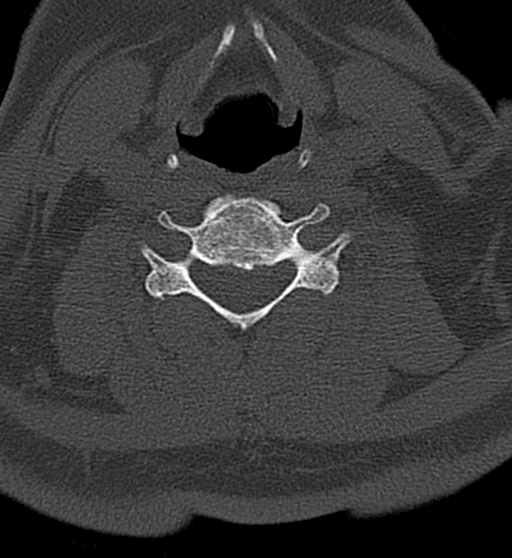
[im 61/105  bone]
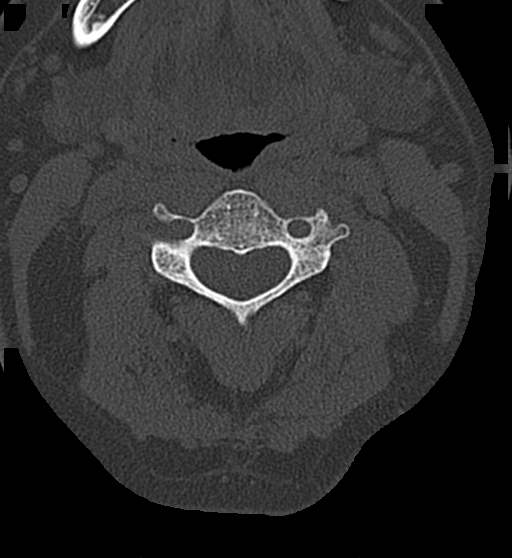
[im 70/105  bone]
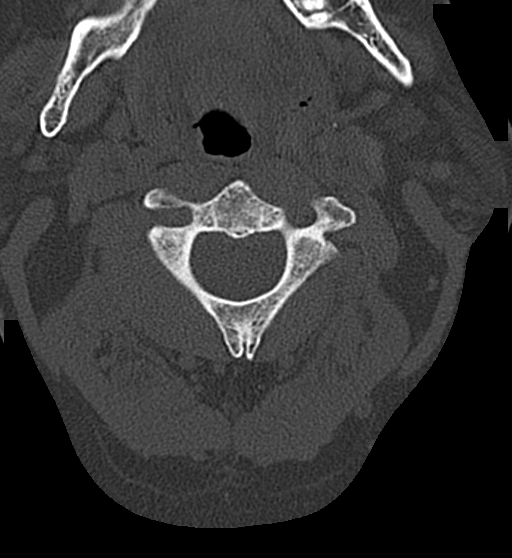
[im 79/105  bone]
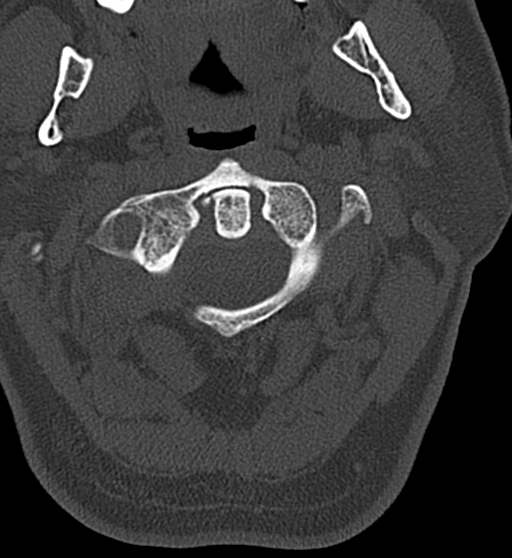
[im 96/105  bone]
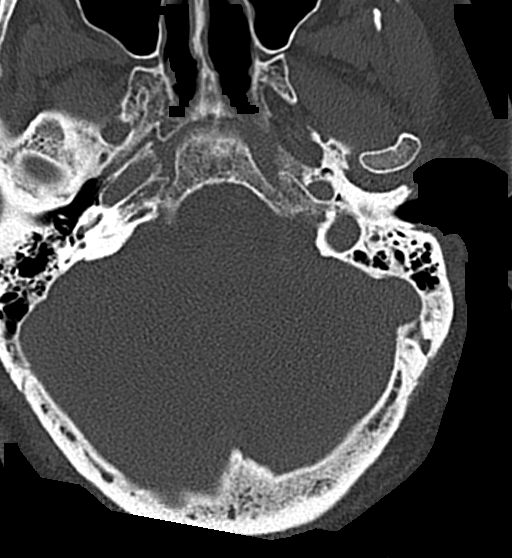

[17 of 47 positions shown; findings below may reference images not displayed]

FINDINGS: CT HEAD FINDINGS

Brain: No acute intracranial abnormality. Specifically, no
hemorrhage, hydrocephalus, mass lesion, acute infarction, or
significant intracranial injury.

Vascular: No hyperdense vessel or unexpected calcification.

Skull: No acute calvarial abnormality.

Sinuses/Orbits: Visualized paranasal sinuses and mastoids clear.
Orbital soft tissues unremarkable.

Other: None

CT CERVICAL SPINE FINDINGS

Alignment: No subluxation.  Loss of cervical lordosis.

Skull base and vertebrae: No acute fracture. No primary bone lesion
or focal pathologic process.

Soft tissues and spinal canal: No prevertebral fluid or swelling. No
visible canal hematoma.

Disc levels: Diffuse degenerative facet disease bilaterally.
Degenerative disc disease from C4-5 through C6-7 with disc space
narrowing and spurring.

Upper chest: No acute findings

Other: None
IMPRESSION: No acute intracranial abnormality.

Cervical spondylosis. Cervical straightening. No acute bony
abnormality.
